# Patient Record
Sex: Male | Born: 1950 | Hispanic: Yes | Marital: Married | Smoking: Former smoker
Health system: Southern US, Community
[De-identification: ages and names within clinical notes are randomized; demographics above are authoritative.]

---

## 2006-10-29 ENCOUNTER — Ambulatory Visit: Payer: Self-pay | Admitting: Internal Medicine

## 2006-10-29 ENCOUNTER — Inpatient Hospital Stay (HOSPITAL_COMMUNITY): Admission: EM | Admit: 2006-10-29 | Discharge: 2006-10-30 | Payer: Self-pay | Admitting: Emergency Medicine

## 2008-06-21 IMAGING — CT CT HEAD W/O CM
1 of 2 series · 13 of 30 positions shown, 17 images · non-contrast
Comparison: None.

CLINICAL DATA: The patient hit the back of his head during a syncopal episode
today.

HEAD CT WITHOUT CONTRAST
TECHNIQUE: 5mm collimated images were obtained from the base of the skull
through the vertex, according to standard protocol, without contrast.

[Series 2: brain · axial · 0.47mm/px · z∈[+98,+231]mm · 13 of 32 slices shown, 17 images]
[im 3/32  brain]
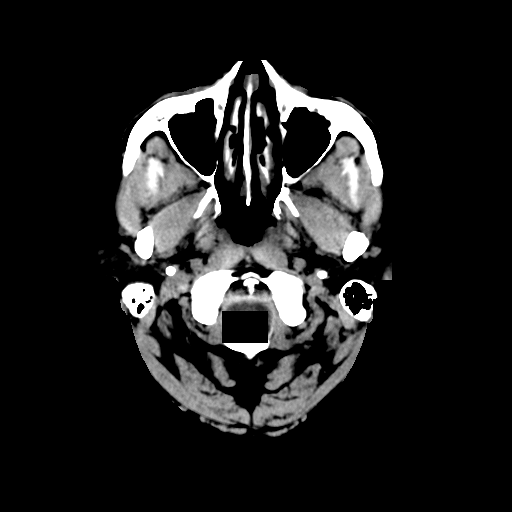
[im 3/32  bone]
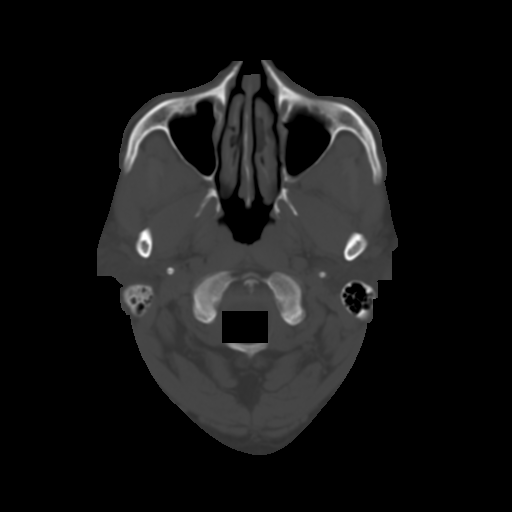
[im 5/32  brain]
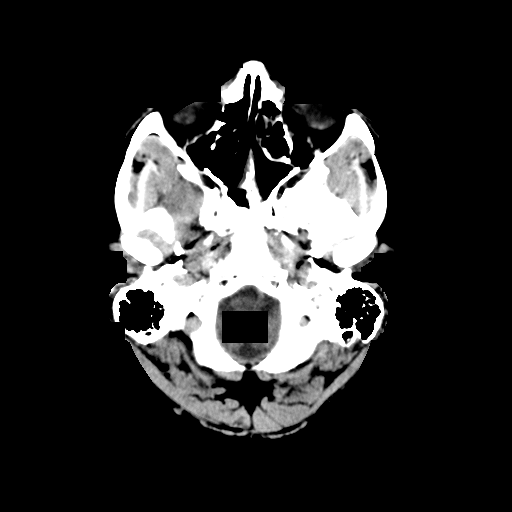
[im 7/32  brain]
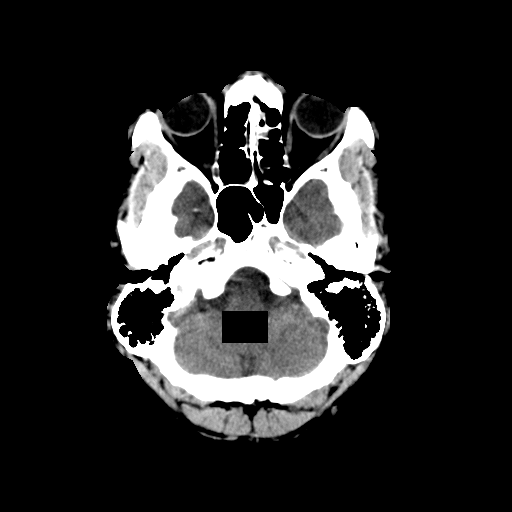
[im 9/32  brain]
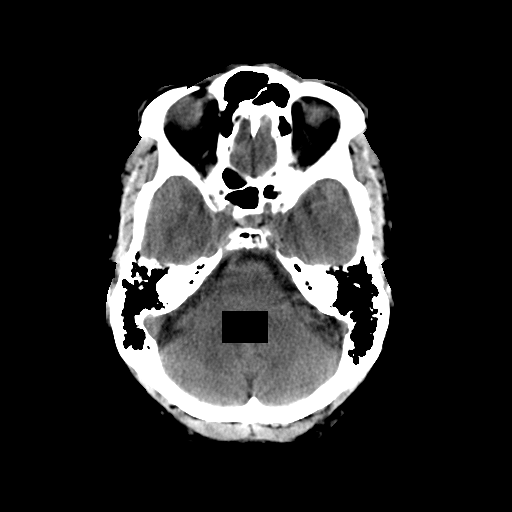
[im 12/32  brain]
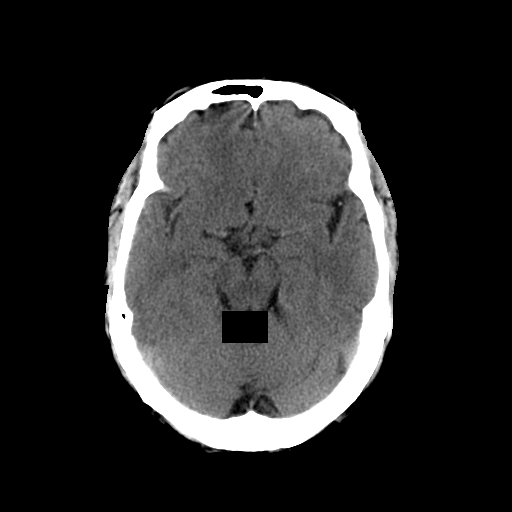
[im 12/32  bone]
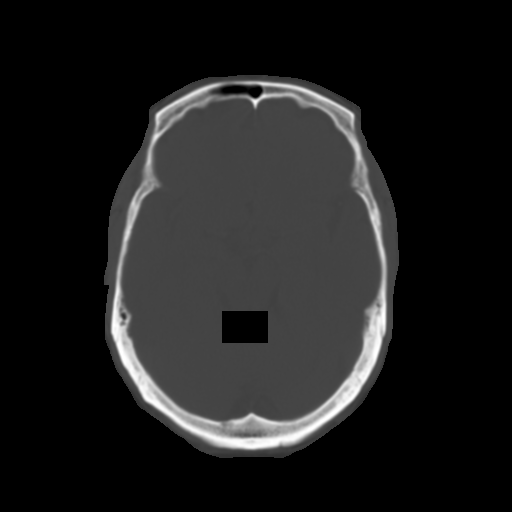
[im 14/32  brain]
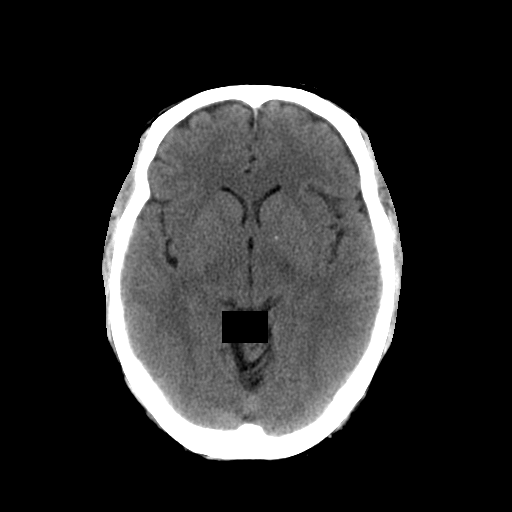
[im 16/32  brain]
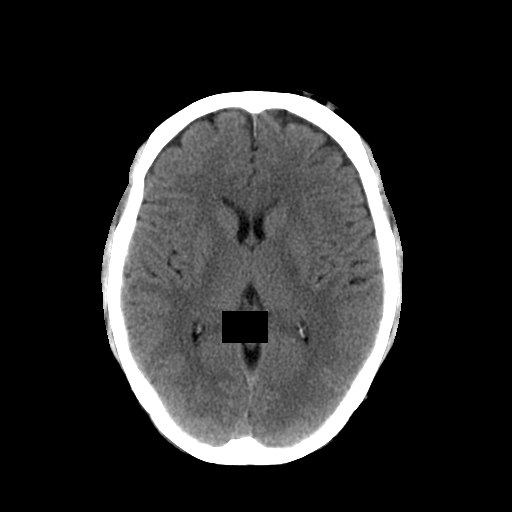
[im 18/32  brain]
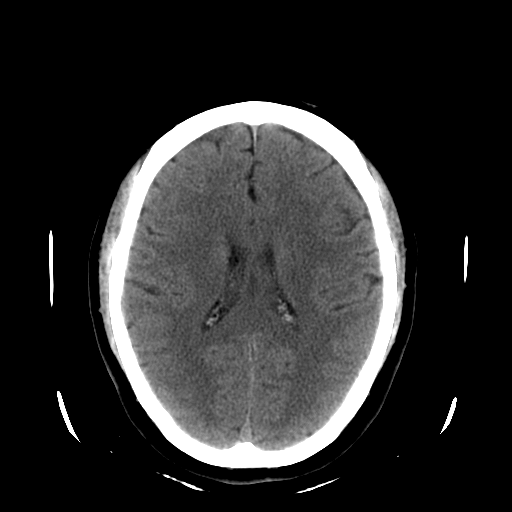
[im 20/32  brain]
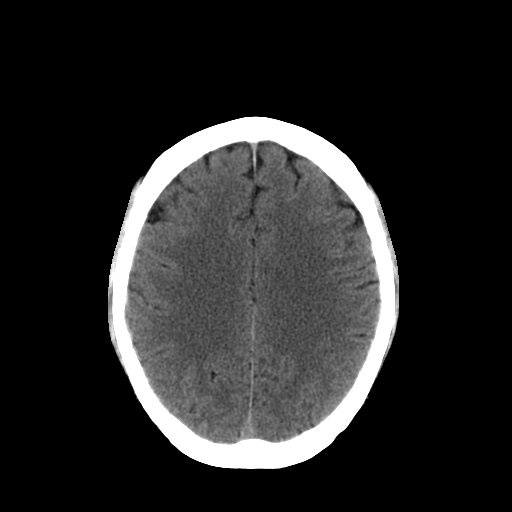
[im 20/32  bone]
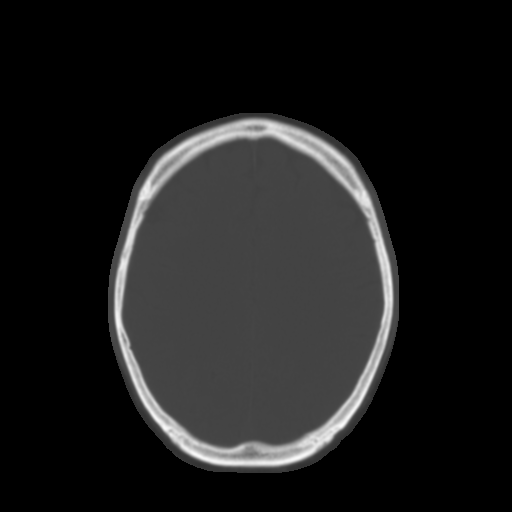
[im 23/32  brain]
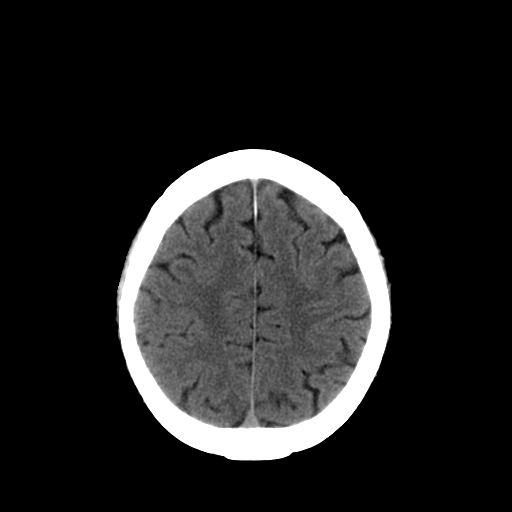
[im 25/32  brain]
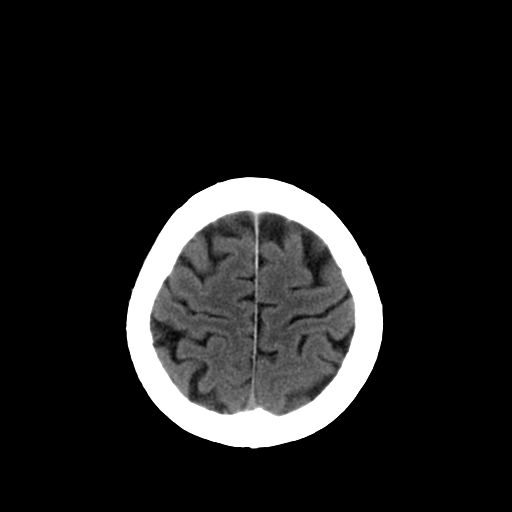
[im 27/32  brain]
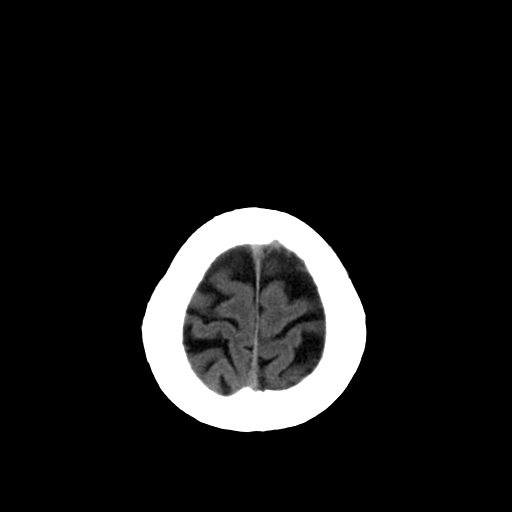
[im 29/32  brain]
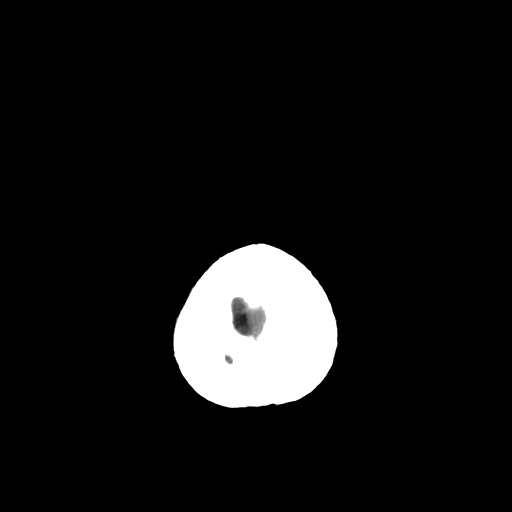
[im 29/32  bone]
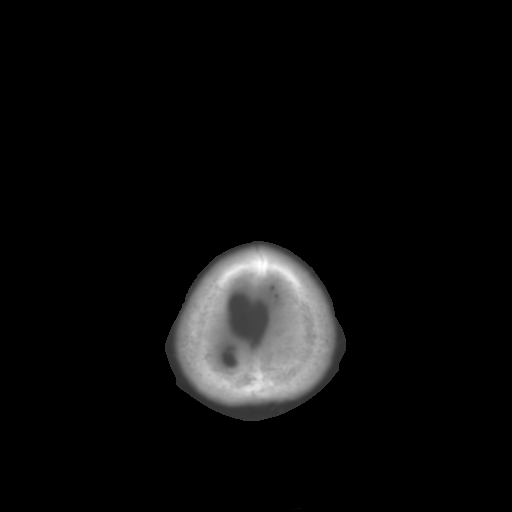

[13 of 30 positions shown; findings below may reference images not displayed]

FINDINGS: Normal appearing cerebral hemispheres and posterior fossa structures.
Normal size and position of the ventricles.  No skull fracture, intracranial
hemorrhage, mass effect or paranasal sinus air/fluid levels. Bilateral ethmoid
sinus mucosal thickening is noted.

IMPRESSION

Chronic bilateral ethmoid sinusitis. Otherwise, normal examination.

## 2010-10-10 NOTE — H&P (Signed)
NAME:  Clarence Marshall, Clarence Marshall                   ACCOUNT NO.:  [id number]   MEDICAL RECORD NO.:  [id number]          PATIENT TYPE:  INP   LOCATION:  1825                         FACILITY:  [facility]   PHYSICIAN:  Bevelyn Buckles. Bensimhon, MDDATE OF BIRTH:  03-07-1951   DATE OF ADMISSION:  10/29/2006  DATE OF DISCHARGE:                              HISTORY & PHYSICAL   PRIMARY CARDIOLOGIST:  Bevelyn Buckles. Bensimhon, MD   PRIMARY CARE PHYSICIAN:  [facility] - Dr. Christell Constant.   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  He states that he did not lose consciousness but  things did go black very quickly and transiently.  He had a friend who  was there with him who did not allow him to get up.  The patient did  bump his head.  They called EMS and EMS came and assisted him and  everything vital sign wise was found to be normal but he was recommended  to be transported to the emergency room.  The patient refused.  A family  member drove him to his family practice physician where he was seen and  examined.  An EKG was completed.  Initially the EKG showed some ST  elevation noted in V3 and V4 and this EKG was faxed to our office and  was read by Dr. Juanda Chance who felt this patient was having an ST elevated  MI and recommended immediate transport to [facility] ER via EMS.  The  patient refused transport via EMS and insisted that family members bring  him instead.  During initial evaluation at [facility], along  with EKG.  The patient also had aspirin given and his blood sugar was  checked and was found to be 150.  Orthostatics were also completed and  blood pressure lying was 112/79.  Blood pressure sitting 108/71 and  blood pressure standing was 108/72.  Of note, the patient did not  experience  shortness of breath, chest pain, nausea, vomiting or  diaphoresis during the episode described above.  The patient was seen  and examined in the emergency room by myself and Dr. Arvilla Meres.  Repeat EKG revealed normal sinus rhythm without any acute ST T wave  changes.  The patient had been complaining of some headaches which had  been going on over the last week prior to the syncopal episode.   REVIEW OF SYSTEMS:  Positive for near syncope.  Negative for chest pain,  shortness of breath, diaphoresis, nausea or vomiting.   PAST MEDICAL HISTORY:  BPH.   SOCIAL HISTORY:  The patient lives in [place] with his wife.  He works in  Doctor, hospital.  He is married with children.  He is a 12-pack-  year smoker but he stopped 20 years ago.  He had heavy alcohol use in  the past but does not drink as much  now.  No illicit drug use.  He is  very active doing outdoor labor. He is not on any specific diet.  No  herbal medicine use.   FAMILY HISTORY:  Mother with multiple myocardial infarctions but died of  breast cancer.  Father died of a gunshot wound when he was a child.  He  has a brother with rheumatoid arthritis   CURRENT MEDICATIONS:  None.   ALLERGIES:  Lamisil.   LABORATORY DATA:  Pending.   PHYSICAL EXAMINATION:  VITAL SIGNS:  Blood pressure 115/69, pulse 71,  respirations 20, temperature was 97.1.  The patient weighed 168.4  pounds.  EKG now revealing normal sinus rhythm.  HEENT:  Head is normocephalic and atraumatic.  Eyes are PERRLA.  Mucous  membranes of the mouth are pink and moist.  Tongue is midline.  NECK:  Supple.  There is no JVD.  There are no carotid bruits  appreciated.  CARDIOVASCULAR:  Regular rate and rhythm without murmurs, rubs or  gallops.  LUNGS:  Clear to auscultation.  ABDOMEN:  Soft and nontender.  There is no rebound or guarding.  EXTREMITIES:  Without clubbing, cyanosis or edema.  NEURO:  Cranial nerves II-XII are grossly intact.  There are no  deficits  noted on neuro exam.   IMPRESSION:  Syncope and pre-syncopal episode.  Suspect this is  secondary to inhalation.  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   PLAN:  We will admit him to rule out cardiac etiology of this episode.  We will check an ABG for carboxy hemoglobin.  We will also do an  echocardiogram for LV function.  We will also do a head CT to rule out  any abnormalities which may be etiology for syncopal episode and chronic  headaches.  The patient will be admitted over night and if ruled out, he  will be discharged.  The patient will be recommended for an outpatient  Myoview if things are normal during this hospitalization.  This has been  discussed with the patient who verbalizes understanding and is wanting  to be admitted and undergo testing.  Further recommendations for this  patient will be depending upon hospital course and results.      Bettey Mare. Lyman Bishop, NP      Bevelyn Buckles. Bensimhon, MD  Electronically Signed    KML/MEDQ  D:  10/29/2006  T:  10/29/2006  Job:  [id number]   cc:   Dr. Christell Constant

## 2010-10-10 NOTE — Discharge Summary (Signed)
NAME:  Clarence Marshall, Clarence Marshall                   ACCOUNT NO.:  [id number]   MEDICAL RECORD NO.:  [id number]          PATIENT TYPE:  INP   LOCATION:  4733                         FACILITY:  [facility]   PHYSICIAN:  Bevelyn Buckles. Bensimhon, MDDATE OF BIRTH:  10-15-1950   DATE OF ADMISSION:  10/29/2006  DATE OF DISCHARGE:  10/30/2006                               DISCHARGE SUMMARY   PRIMARY CARDIOLOGIST:  Dr. Arvilla Meres.   PRIMARY CARE PHYSICIAN:  Dr. Christell Constant at [facility].   PROCEDURES PERFORMED DURING HOSPITALIZATION:  None.   DISCHARGE DIAGNOSES:  1. Syncopal episode in the setting of carbon monoxide poisoning.  2. Hypercholesterolemia.   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  The  patient did not totally loose consciousness, but did fall and hit his  head.  EMS was called and vital signs were taken and found to be normal.  They advised that the patient come to the emergency room, but he refused  and did follow up with Dr. Kathi Der office at [facility] secondary to this event.   During the evaluation at [facility], the patient  had an EKG that was found to be abnormal revealing ST elevation in V3  and V4.  The EKG was faxed to our office and read by Dr. Juanda Chance who felt  the patient was having an ST elevated MI and recommended immediate  transport to [facility] ER via EMS.  The patient refused transport via  EMS and insisting that his family members bring him.  He was without  chest pain or shortness of breath and the syncopal episode did not  return.  His main complaint was a headache.  During evaluation at  [facility], the patient was given aspirin and  his blood sugar was evaluated and found to be 150.  The patient did sign  AMA for refusal to be transported via EMS and did come to the emergency  room through family member.   On evaluation in the emergency room, the patient was seen and examined  by myself and Dr. Arvilla Meres.  Repeat EKG revealed normal sinus  rhythm without any acute ST/T wave changes and a code stemi was  cancelled.  The patient's only complaint was of headaches, which had  been ongoing over the last week prior to the syncopal episode.   A friend who was working with Mr. 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  A carboxyhemoglobin was drawn to evaluate for this in the  setting of this episode and was found to be severely high at 12.0.  His  FaO2 was 95.5.  The patient's troponins were also cycled and found to be  negative x3.  The patient also had a CT scan completed to evaluate for  any fractured leads or abnormalities.  CT scan revealed chronic  bilateral ethmoid sinusitis; otherwise, normal examination.  Subsequent  EKGs revealed normal sinus rhythm without any evidence of ST/T wave  abnormality.  The patient was seen and examined by Dr. Arvilla Meres  date of discharged and found to be stable.  Echocardiogram will be  completed prior to his discharge with followup outpatient stress Myoview  and appointment with Dr. Gala Romney on discharge.  Dr. Gala Romney also did  call [employer]SHA to report the carbon monoxide poisoning in the setting of this  patient's episode and also will follow up with a letter  concerning this  and the plan has been notified as well.   DISCHARGE LABS:  Cholesterol 217, triglycerides 138, HDL 37, LDL 152.  Sodium 134, potassium 4.2, chloride 101, CO2 26, glucose 102, BUN 19,  creatinine 1.0.  Carboxyhemoglobin 12.0.  O2 saturation 95.5.  Total  hemoglobin 13.7.  Hemoglobin 14.9, hematocrit 43.3, white blood cell is  8.6, platelets 236.   VITALS ON DISCHARGE:  Blood pressure 104/63, pulse 69, respirations 18,  temperature 97.1, O2 saturation 97% on room air.   DISCHARGE MEDICATIONS:  None.   FOLLOWUP PLANS AND APPOINTMENT:  1. The patient is scheduled for a stress test on June 13 at 9:30 a.m.      The patient has been advised to have nothing to eat after midnight      prior to the test.  2. The patient has a followup appointment with Dr. Arvilla Meres on      July 3 at 10:15 a.m.  3. The proper authorities have been notified by Dr. Gala Romney      concerning the patient's work place and carbon monoxide poisoning.  4. The patient will follow up with Dr. Christell Constant at [facility] for any further medical management necessary.  5. Echocardiogram results will be discussed with the patient when seen      by Dr. Gala Romney.  6. Determination of need to start on statins secondary to elevated      cholesterol and LDL at Dr. Prescott Gum discretion.   Time spent with the patient, to include physician time, 40 minutes.      Bettey Mare. Lyman Bishop, NP      Bevelyn Buckles. Bensimhon, MD  Electronically Signed    KML/MEDQ  D:  10/30/2006  T:  10/30/2006  Job:  [id number]   cc:   Christell Constant, M.D.

## 2011-03-15 LAB — CK TOTAL AND CKMB (NOT AT ARMC)
CK, MB: 1.4
Relative Index: 1.3
Total CK: 107

## 2011-03-15 LAB — BASIC METABOLIC PANEL
CO2: 26
Chloride: 101
GFR calc Af Amer: >60
Glucose, Bld: 102 — ABNORMAL HIGH
Potassium: 4.2
Sodium: 134 — ABNORMAL LOW

## 2011-03-15 LAB — CARDIAC PANEL(CRET KIN+CKTOT+MB+TROPI)
CK, MB: 1
Relative Index: INVALID
Relative Index: INVALID
Total CK: 81

## 2011-03-15 LAB — DIFFERENTIAL
Basophils Relative: 0
Lymphs Abs: 2
Monocytes Absolute: 0.5
Monocytes Relative: 6
Neutro Abs: 6

## 2011-03-15 LAB — CARBOXYHEMOGLOBIN
Carboxyhemoglobin: 12
Methemoglobin: 0.9

## 2011-03-15 LAB — POCT I-STAT 3, ART BLOOD GAS (G3+)
Acid-base deficit: 1
O2 Saturation: 97
Patient temperature: 98.7
TCO2: 24
pCO2 arterial: 38.9

## 2011-03-15 LAB — POCT CARDIAC MARKERS
CKMB, poc: <1 — ABNORMAL LOW
Myoglobin, poc: 133
Troponin i, poc: <0.05

## 2011-03-15 LAB — COMPREHENSIVE METABOLIC PANEL
ALT: 27
Albumin: 4.2
Alkaline Phosphatase: 61
Calcium: 9.6
Potassium: 4.1
Sodium: 138
Total Protein: 7.7

## 2011-03-15 LAB — CBC
Hemoglobin: 14.9
RBC: 4.57
WBC: 8.6

## 2011-03-15 LAB — TROPONIN I: Troponin I: 0.01

## 2011-03-15 LAB — LIPID PANEL
Cholesterol: 217 — ABNORMAL HIGH
LDL Cholesterol: 152 — ABNORMAL HIGH
Triglycerides: 138
VLDL: 28

## 2011-03-15 LAB — PROTIME-INR: INR: 1

## 2012-10-28 ENCOUNTER — Ambulatory Visit (INDEPENDENT_AMBULATORY_CARE_PROVIDER_SITE_OTHER): Payer: BC Managed Care – PPO | Admitting: Family Medicine

## 2012-10-28 ENCOUNTER — Encounter: Payer: Self-pay | Admitting: Family Medicine

## 2012-10-28 VITALS — BP 109/66 | HR 64 | Temp 96.7°F | Ht 65.0 in | Wt 155.8 lb

## 2012-10-28 DIAGNOSIS — L239 Allergic contact dermatitis, unspecified cause: Secondary | ICD-10-CM

## 2012-10-28 DIAGNOSIS — L259 Unspecified contact dermatitis, unspecified cause: Secondary | ICD-10-CM

## 2012-10-28 NOTE — Progress Notes (Signed)
  Subjective:    Patient ID: Clarence Marshall, male    DOB: 09/16/1950, 62 y.o.   MRN: [id number]  HPI Patient has noticed a rash off and on for a couple of days more prominent in the axillary areas under his arms on his [place] and his upper legs. He is thinking it may have come from a deoderant that he was using.    Review of Systems  Skin: Positive for rash (itchy x 2 days, started in the axillary area, arms ,legs and abdomen).       Objective:   Physical Exam  Vitals reviewed. Constitutional: He is oriented to person, place, and time. He appears well-developed and well-nourished. No distress.  HENT:  Head: Normocephalic.  Musculoskeletal: He exhibits no edema.  Neurological: He is alert and oriented to person, place, and time.  Skin: Skin is warm and dry. No rash noted.   No rash apparent at this time.       Assessment & Plan:  Allergic dermatitis  Patient Instructions  Avoid scented deodorant-----ban scent free is best Avoid scented fabric softeners----use  scent  Free Use [place] Snow or dreft detergent Use Dove or [place] soap for bathing Take Benadryl 25 mg over-the-counter if needed for  rash or itching If rash continues to occur call back and we will call in a prescription for prednisone

## 2012-10-28 NOTE — Patient Instructions (Addendum)
Avoid scented deodorant-----ban scent free is best Avoid scented fabric softeners----use  scent  Free Use [place] Snow or dreft detergent Use Dove or [place] soap for bathing Take Benadryl 25 mg over-the-counter if needed for  rash or itching If rash continues to occur call back and we will call in a prescription for prednisone

## 2013-12-16 ENCOUNTER — Encounter: Payer: Self-pay | Admitting: Family Medicine

## 2013-12-16 ENCOUNTER — Ambulatory Visit (INDEPENDENT_AMBULATORY_CARE_PROVIDER_SITE_OTHER): Payer: BC Managed Care – PPO | Admitting: Family Medicine

## 2013-12-16 VITALS — BP 116/64 | HR 69 | Temp 98.0°F | Ht 65.0 in | Wt 158.0 lb

## 2013-12-16 DIAGNOSIS — N529 Male erectile dysfunction, unspecified: Secondary | ICD-10-CM

## 2013-12-16 DIAGNOSIS — N521 Erectile dysfunction due to diseases classified elsewhere: Secondary | ICD-10-CM | POA: Insufficient documentation

## 2013-12-16 MED ORDER — SILDENAFIL CITRATE 100 MG PO TABS: 50.0000 mg | ORAL_TABLET | Freq: Every day | ORAL | Status: DC | PRN

## 2013-12-16 NOTE — Patient Instructions (Signed)
Call insurance company to verify coverage of tetanus shot. May schedule an appointment with the nurse to receive shot.

## 2013-12-16 NOTE — Addendum Note (Signed)
Addended by: Gwenith DailyHUDY, KRISTEN N on: 12/16/2013 03:20 PM   Modules accepted: Orders

## 2013-12-16 NOTE — Progress Notes (Signed)
   Subjective:    Patient ID: Clarence Marshall, male    DOB: 05/11/1951, 63 y.o.   MRN: [id number]  HPI 63 year old male here to seemingly get reestablished. He says that he has been a patient here for many many years. He is very elevation he has some concerns about a tick bite that has persisted carotis for 2-3 weeks. He also has a concern about erectile dysfunction and is requesting a prescription for Viagra.    Review of Systems  Constitutional: Negative.   HENT: Negative.   Eyes: Negative.   Respiratory: Negative.   Cardiovascular: Negative.   Gastrointestinal: Negative.   Genitourinary: Negative.   Musculoskeletal: Negative.   Skin: Negative.   Neurological: Negative.   Psychiatric/Behavioral: Negative.        Objective:   Physical Exam  Constitutional: He is oriented to person, place, and time. He appears well-developed and well-nourished.  HENT:  Head: Normocephalic.  Right Ear: External ear normal.  Left Ear: External ear normal.  Nose: Nose normal.  Mouth/Throat: Oropharynx is clear and moist.  Eyes: Conjunctivae and EOM are normal. Pupils are equal, round, and reactive to light.  Neck: Normal range of motion. Neck supple.  Cardiovascular: Normal rate, regular rhythm, normal heart sounds and intact distal pulses.   Pulmonary/Chest: Effort normal and breath sounds normal.  Abdominal: Soft. Bowel sounds are normal.  Musculoskeletal: Normal range of motion.  Neurological: He is alert and oriented to person, place, and time.  Skin: Skin is warm and dry.  Psychiatric: He has a normal mood and affect. His behavior is normal. Judgment and thought content normal.          Assessment & Plan:  This is basically a healthy male. We discussed preventive measures such as Prevnar DTaP and he will think about those. I did provide a prescription for Viagra there being no contraindication that I am aware .Frederica KusterStephen M Lakoda Mcanany MD

## 2018-09-23 ENCOUNTER — Ambulatory Visit (INDEPENDENT_AMBULATORY_CARE_PROVIDER_SITE_OTHER): Payer: BC Managed Care – PPO | Admitting: Family Medicine

## 2018-09-23 ENCOUNTER — Encounter: Payer: Self-pay | Admitting: Family Medicine

## 2018-09-23 ENCOUNTER — Other Ambulatory Visit: Payer: Self-pay

## 2018-09-23 DIAGNOSIS — L247 Irritant contact dermatitis due to plants, except food: Secondary | ICD-10-CM

## 2018-09-23 MED ORDER — PREDNISONE 10 MG PO TABS: ORAL_TABLET | ORAL | 0 refills | Status: DC

## 2018-09-23 NOTE — Progress Notes (Signed)
Subjective:    Patient ID: Clarence Marshall, male    DOB: [birth year], 68 y.o.   MRN: [id number]   HPI: Clarence Marshall is a 68 y.o. male presenting for poison ivy rash for 2 weeks. Some relief with benadryl pills and caladryl cream. Forearms still itch. Flares when active and when laying on arms.Marland Kitchen Applying cool water and alcohol.  Smal red bumps. No blistering. Flushed redness as well. Covers all of each ventral forearm.   (I attempted to switch to video, but the patinet's phone would not connect to the Doximity link.)   Relevant past medical, surgical, family and social history reviewed and updated as indicated.  Interim medical history since our last visit reviewed. Allergies and medications reviewed and updated.  ROS:  Review of Systems  Constitutional: Negative for fever.  Respiratory: Negative for shortness of breath.   Cardiovascular: Negative for chest pain.  Skin: Negative for rash.     Social History   Tobacco Use  Smoking Status Former Smoker  . Packs/day: 1.00  . Start date: 05/28/1972  . Last attempt to quit: 10/29/1982  . Years since quitting: 35.9       Objective:     Wt Readings from Last 3 Encounters:  12/16/13 158 lb (71.7 kg)  10/28/12 155 lb 12.8 oz (70.7 kg)     Exam deferred. Pt. Harboring due to COVID 19. Phone visit performed.   Assessment & Plan:   1. Irritant contact dermatitis due to plants, except food     Meds ordered this encounter  Medications  . DISCONTD: predniSONE (DELTASONE) 10 MG tablet    Sig: Take 5 daily for 2 days followed by 4,3,2 and 1 for 2 days each.    Dispense:  30 tablet    Refill:  0  . predniSONE (DELTASONE) 10 MG tablet    Sig: Take 5 daily for 2 days followed by 4,3,2 and 1 for 2 days each.    Dispense:  30 tablet    Refill:  0    No orders of the defined types were placed in this encounter.     Diagnoses and all orders for this visit:  Irritant contact dermatitis due to plants, except food  Other orders -      Discontinue: predniSONE (DELTASONE) 10 MG tablet; Take 5 daily for 2 days followed by 4,3,2 and 1 for 2 days each. -     predniSONE (DELTASONE) 10 MG tablet; Take 5 daily for 2 days followed by 4,3,2 and 1 for 2 days each.    Virtual Visit via telephone Note  I discussed the limitations, risks, security and privacy concerns of performing an evaluation and management service by telephone and the availability of in person appointments. The patient was identified with two identifiers. Pt.expressed understanding and agreed to proceed. Pt. Is at home. Dr. Darlyn Read is in his office.  Follow Up Instructions:   I discussed the assessment and treatment plan with the patient. The patient was provided an opportunity to ask questions and all were answered. The patient agreed with the plan and demonstrated an understanding of the instructions.   The patient was advised to call back or seek an in-person evaluation if the symptoms worsen or if the condition fails to improve as anticipated.  Visit started: 3:20 Call ended:  3:37 Total minutes including chart review and phone contact time: 17   Follow up plan: Return if symptoms worsen or fail to improve.  Mechele Claude, MD [place]  [facility]

## 2022-01-24 ENCOUNTER — Ambulatory Visit: Payer: BC Managed Care – PPO | Admitting: Family Medicine

## 2022-02-13 ENCOUNTER — Ambulatory Visit: Payer: 59 | Admitting: Family Medicine

## 2022-02-13 ENCOUNTER — Encounter: Payer: Self-pay | Admitting: Family Medicine

## 2022-02-13 VITALS — BP 123/67 | HR 63 | Temp 98.2°F | Ht 65.0 in | Wt 159.4 lb

## 2022-02-13 DIAGNOSIS — Z125 Encounter for screening for malignant neoplasm of prostate: Secondary | ICD-10-CM | POA: Diagnosis not present

## 2022-02-13 DIAGNOSIS — E559 Vitamin D deficiency, unspecified: Secondary | ICD-10-CM

## 2022-02-13 DIAGNOSIS — Z0001 Encounter for general adult medical examination with abnormal findings: Secondary | ICD-10-CM

## 2022-02-13 DIAGNOSIS — Z0000 Encounter for general adult medical examination without abnormal findings: Secondary | ICD-10-CM

## 2022-02-13 DIAGNOSIS — Z23 Encounter for immunization: Secondary | ICD-10-CM

## 2022-02-13 NOTE — Addendum Note (Signed)
Addended by: Baldomero Lamy B on: 02/13/2022 05:00 PM   Modules accepted: Orders

## 2022-02-13 NOTE — Progress Notes (Signed)
Subjective:  Patient ID: Clarence Marshall, male    DOB: [birth year]  Age: 71 y.o. MRN: [id number]  CC: New Patient (Initial Visit)   HPI Clarence Marshall presents for new  (in a long time) pt. 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   Clarence Marshall has no past surgical history on file.   His family history includes Arthritis in his brother; Heart attack in his mother.Clarence Marshall reports that Clarence Marshall quit smoking about 39 years ago. His smoking use included cigarettes. Clarence Marshall started smoking about 49 years ago. Clarence Marshall smoked an average of 1 pack per day. Clarence Marshall does not have any smokeless tobacco history on file. Clarence Marshall reports current alcohol use. Clarence Marshall reports that Clarence Marshall does not use drugs.    ROS Review of Systems  Constitutional:  Negative for activity change, fatigue and unexpected weight change.  HENT:  Negative for congestion, ear pain, hearing loss, postnasal drip and trouble swallowing.   Eyes:  Positive for visual disturbance (pterygium left eye. Need surgery). Negative for pain.  Respiratory:  Negative for cough, chest tightness and shortness of breath.   Cardiovascular:  Negative for chest pain, palpitations and leg swelling.  Gastrointestinal:  Positive for abdominal pain (occasional heartburn from eating spicy food 1-2 times a week.). Negative for abdominal distention, blood in stool, constipation, diarrhea, nausea and vomiting.  Endocrine: Negative for cold intolerance, heat intolerance and polydipsia.  Genitourinary:  Negative for difficulty urinating, dysuria, flank pain, frequency and urgency.  Musculoskeletal:  Negative for arthralgias and joint swelling.  Skin:  Negative for color change, rash and wound.  Neurological:  Negative for dizziness, syncope, speech difficulty, weakness, light-headedness, numbness and headaches.   Hematological:  Does not bruise/bleed easily.  Psychiatric/Behavioral:  Negative for confusion, decreased concentration, dysphoric mood and sleep disturbance. The patient is not nervous/anxious.     Objective:  BP 123/67   Pulse 63   Temp 98.2 F (36.8 C)   Ht _0  (1.651 m)   Wt 159 lb 6.4 oz (72.3 kg)   SpO2 96%   BMI 26.53 kg/m   BP Readings from Last 3 Encounters:  02/13/22 123/67  12/16/13 116/64  10/28/12 109/66    Wt Readings from Last 3 Encounters:  02/13/22 159 lb 6.4 oz (72.3 kg)  12/16/13 158 lb (71.7 kg)  10/28/12 155 lb 12.8 oz (70.7 kg)     Physical Exam Vitals reviewed.  Constitutional:      Appearance: Clarence Marshall is well-developed.  HENT:     Head: Normocephalic and atraumatic.     Right Ear: External ear normal.     Left Ear: External ear normal.     Mouth/Throat:     Pharynx: No oropharyngeal exudate or posterior oropharyngeal erythema.  Eyes:     Pupils: Pupils are equal, round, and reactive to light.  Cardiovascular:     Rate and Rhythm: Normal rate and regular rhythm.     Heart sounds: No murmur heard. Pulmonary:     Effort: No respiratory distress.     Breath sounds: Normal breath sounds.  Abdominal:     General: Abdomen is flat. Bowel sounds are normal. There is no distension.     Palpations: Abdomen is soft. There is no mass.  Tenderness: There is no abdominal tenderness.  Musculoskeletal:        General: No swelling, tenderness or deformity. Normal range of motion.     Cervical back: Normal range of motion and neck supple.  Skin:    General: Skin is warm and dry.  Neurological:     Mental Status: Clarence Marshall is alert and oriented to person, place, and time.     Motor: No weakness.     Coordination: Coordination normal.  Psychiatric:        Mood and Affect: Mood normal.        Behavior: Behavior normal.        Thought Content: Thought content normal.       Assessment & Plan:   Clarence Marshall was seen today for new patient (initial  visit).  Diagnoses and all orders for this visit:  Well adult exam -     CBC with Differential/Platelet -     CMP14+EGFR -     Lipid panel -     PSA, total and free -     VITAMIN D 25 Hydroxy (Vit-D Deficiency, Fractures)  Screening for prostate cancer -     PSA, total and free  Vitamin D deficiency -     VITAMIN D 25 Hydroxy (Vit-D Deficiency, Fractures)       I have discontinued Clarence Marshall's sildenafil and predniSONE.  Allergies as of 02/13/2022       Reactions   Lamisil [terbinafine] Rash        Medication List        Accurate as of February 13, 2022  2:54 PM. If you have any questions, ask your nurse or doctor.          STOP taking these medications    predniSONE 10 MG tablet Commonly known as: DELTASONE Stopped by: Claretta Fraise, MD   sildenafil 100 MG tablet Commonly known as: Viagra Stopped by: Claretta Fraise, MD         Follow-up: Return in about 1 year (around 02/14/2023).  Claretta Fraise, M.D.

## 2022-02-14 LAB — CMP14+EGFR
ALT: 16 IU/L (ref 0–44)
AST: 14 IU/L (ref 0–40)
Albumin/Globulin Ratio: 1.6 (ref 1.2–2.2)
Albumin: 4.5 g/dL (ref 3.8–4.8)
Alkaline Phosphatase: 64 IU/L (ref 44–121)
BUN/Creatinine Ratio: 17 (ref 10–24)
BUN: 21 mg/dL (ref 8–27)
Bilirubin Total: 0.3 mg/dL (ref 0.0–1.2)
CO2: 22 mmol/L (ref 20–29)
Calcium: 9.7 mg/dL (ref 8.6–10.2)
Chloride: 100 mmol/L (ref 96–106)
Creatinine, Ser: 1.21 mg/dL (ref 0.76–1.27)
Globulin, Total: 2.9 g/dL (ref 1.5–4.5)
Glucose: 95 mg/dL (ref 70–99)
Potassium: 4.7 mmol/L (ref 3.5–5.2)
Sodium: 139 mmol/L (ref 134–144)
Total Protein: 7.4 g/dL (ref 6.0–8.5)
eGFR: 63 mL/min/{1.73_m2} (ref >59)

## 2022-02-14 LAB — CBC WITH DIFFERENTIAL/PLATELET
Basophils Absolute: 0 10*3/uL (ref 0.0–0.2)
Basos: 1 %
EOS (ABSOLUTE): 0.5 10*3/uL — ABNORMAL HIGH (ref 0.0–0.4)
Eos: 7 %
Hematocrit: 41.5 % (ref 37.5–51.0)
Hemoglobin: 14 g/dL (ref 13.0–17.7)
Immature Grans (Abs): 0 10*3/uL (ref 0.0–0.1)
Immature Granulocytes: 0 %
Lymphocytes Absolute: 2.1 10*3/uL (ref 0.7–3.1)
Lymphs: 32 %
MCH: 32.8 pg (ref 26.6–33.0)
MCHC: 33.7 g/dL (ref 31.5–35.7)
MCV: 97 fL (ref 79–97)
Monocytes Absolute: 0.7 10*3/uL (ref 0.1–0.9)
Monocytes: 10 %
Neutrophils Absolute: 3.3 10*3/uL (ref 1.4–7.0)
Neutrophils: 50 %
Platelets: 235 10*3/uL (ref 150–450)
RBC: 4.27 x10E6/uL (ref 4.14–5.80)
RDW: 13.1 % (ref 11.6–15.4)
WBC: 6.6 10*3/uL (ref 3.4–10.8)

## 2022-02-14 LAB — LIPID PANEL
Chol/HDL Ratio: 5.2 ratio — ABNORMAL HIGH (ref 0.0–5.0)
Cholesterol, Total: 229 mg/dL — ABNORMAL HIGH (ref 100–199)
HDL: 44 mg/dL (ref >39)
LDL Chol Calc (NIH): 137 mg/dL — ABNORMAL HIGH (ref 0–99)
Triglycerides: 266 mg/dL — ABNORMAL HIGH (ref 0–149)
VLDL Cholesterol Cal: 48 mg/dL — ABNORMAL HIGH (ref 5–40)

## 2022-02-14 LAB — PSA, TOTAL AND FREE
PSA, Free Pct: 21.7 %
PSA, Free: 0.13 ng/mL
Prostate Specific Ag, Serum: 0.6 ng/mL (ref 0.0–4.0)

## 2022-02-14 LAB — VITAMIN D 25 HYDROXY (VIT D DEFICIENCY, FRACTURES): Vit D, 25-Hydroxy: 29.7 ng/mL — ABNORMAL LOW (ref 30.0–100.0)

## 2022-02-15 NOTE — Progress Notes (Signed)
Dear Clarence Marshall, Your Vitamin D is  low. You need a prescription strength supplement I will send that in for you.Your cholesterol is mildly elevated. Please follow a low-fat diet and exercise regularly. I recommend checking it again in 6 months. Please arrange a follow-up at that time.  Your cholesterol is mildly elevated. Please follow a low-fat diet and exercise regularly. I recommend checking it again in 6 months. Please arrange a follow-up at that time.   Nurse, if at all possible, could you send in a prescription for the patient for vitamin D 50,000 units, 1 p.o. weekly #13 with 3 refills? Many thanks, WS

## 2022-02-19 ENCOUNTER — Other Ambulatory Visit: Payer: Self-pay | Admitting: *Deleted

## 2022-02-19 MED ORDER — VITAMIN D (ERGOCALCIFEROL) 1.25 MG (50000 UNIT) PO CAPS: 50000.0000 [IU] | ORAL_CAPSULE | ORAL | 3 refills | Status: DC

## 2022-08-14 ENCOUNTER — Ambulatory Visit (INDEPENDENT_AMBULATORY_CARE_PROVIDER_SITE_OTHER): Payer: 59 | Admitting: *Deleted

## 2022-08-14 DIAGNOSIS — Z23 Encounter for immunization: Secondary | ICD-10-CM

## 2022-08-14 NOTE — Progress Notes (Signed)
Shingrix given and patient tolerated well.  

## 2023-01-08 ENCOUNTER — Encounter: Payer: Self-pay | Admitting: *Deleted

## 2023-02-11 ENCOUNTER — Ambulatory Visit: Payer: 59 | Admitting: Family Medicine

## 2023-02-11 ENCOUNTER — Encounter: Payer: Self-pay | Admitting: Family Medicine

## 2023-02-11 VITALS — BP 111/66 | HR 64 | Temp 97.8°F | Ht 65.0 in | Wt 164.0 lb

## 2023-02-11 DIAGNOSIS — N401 Enlarged prostate with lower urinary tract symptoms: Secondary | ICD-10-CM

## 2023-02-11 DIAGNOSIS — E559 Vitamin D deficiency, unspecified: Secondary | ICD-10-CM

## 2023-02-11 DIAGNOSIS — M545 Low back pain, unspecified: Secondary | ICD-10-CM

## 2023-02-11 DIAGNOSIS — R351 Nocturia: Secondary | ICD-10-CM | POA: Diagnosis not present

## 2023-02-11 DIAGNOSIS — E782 Mixed hyperlipidemia: Secondary | ICD-10-CM

## 2023-02-11 DIAGNOSIS — N529 Male erectile dysfunction, unspecified: Secondary | ICD-10-CM

## 2023-02-11 DIAGNOSIS — Z0001 Encounter for general adult medical examination with abnormal findings: Secondary | ICD-10-CM | POA: Diagnosis not present

## 2023-02-11 DIAGNOSIS — Z125 Encounter for screening for malignant neoplasm of prostate: Secondary | ICD-10-CM | POA: Diagnosis not present

## 2023-02-11 DIAGNOSIS — Z0000 Encounter for general adult medical examination without abnormal findings: Secondary | ICD-10-CM

## 2023-02-11 DIAGNOSIS — K2100 Gastro-esophageal reflux disease with esophagitis, without bleeding: Secondary | ICD-10-CM

## 2023-02-11 LAB — URINALYSIS
Bilirubin, UA: NEGATIVE
Glucose, UA: NEGATIVE
Ketones, UA: NEGATIVE
Leukocytes,UA: NEGATIVE
Nitrite, UA: NEGATIVE
Protein,UA: NEGATIVE
Specific Gravity, UA: 1.01 (ref 1.005–1.030)
Urobilinogen, Ur: 0.2 mg/dL (ref 0.2–1.0)
pH, UA: 6 (ref 5.0–7.5)

## 2023-02-11 MED ORDER — PREDNISONE 10 MG PO TABS: ORAL_TABLET | ORAL | 0 refills | Status: DC

## 2023-02-11 MED ORDER — TAMSULOSIN HCL 0.4 MG PO CAPS
0.8000 mg | ORAL_CAPSULE | Freq: Every day | ORAL | 3 refills | Status: DC
Start: 2023-02-11 — End: 2024-03-17

## 2023-02-11 MED ORDER — VITAMIN D (ERGOCALCIFEROL) 1.25 MG (50000 UNIT) PO CAPS: 50000.0000 [IU] | ORAL_CAPSULE | ORAL | 3 refills | Status: DC

## 2023-02-11 MED ORDER — SILDENAFIL CITRATE 100 MG PO TABS
50.0000 mg | ORAL_TABLET | Freq: Every day | ORAL | 11 refills | Status: AC | PRN
Start: 2023-02-11 — End: ?

## 2023-02-11 MED ORDER — TIZANIDINE HCL 4 MG PO TABS
4.0000 mg | ORAL_TABLET | Freq: Four times a day (QID) | ORAL | 1 refills | Status: DC | PRN
Start: 2023-02-11 — End: 2024-03-17

## 2023-02-11 NOTE — Progress Notes (Signed)
Subjective:  Patient ID: Clarence Marshall, male    DOB: [birth year]  Age: 72 y.o. MRN: [id number]  CC: Annual Exam and Back Pain   HPI VIVIAN PUROHIT presents for low back pain. Onset 30 years ago. Work required lifting. Lately has flared after doing shoveling and picking. Had a pinched nerve in  past. Feels sharp, pinching now like that did. This episode bad today, onset 2-3 days ago.   Heartburn is less severe now. Trying to avoid greasy & spicy food. Uses famotidine to premedicate if eating spicy.   E D. Problem. Has responded to sildenafil in the past. Would like a refill. Also having nocturia X 2-4 with hesitancy and dribbling in the day,  post micturition       02/11/2023    1:59 PM 02/13/2022    2:25 PM  Depression screen PHQ 2/9  Decreased Interest 0 0  Down, Depressed, Hopeless 0 0  PHQ - 2 Score 0 0    History Jeremie has no past medical history on file.   He has no past surgical history on file.   His family history includes Arthritis in his brother; Heart attack in his mother.He reports that he quit smoking about 40 years ago. His smoking use included cigarettes. He started smoking about 50 years ago. He has a 10.4 pack-year smoking history. He does not have any smokeless tobacco history on file. He reports current alcohol use. He reports that he does not use drugs.    ROS Review of Systems  Constitutional:  Negative for activity change, fatigue and unexpected weight change.  HENT:  Negative for congestion, ear pain, hearing loss, postnasal drip and trouble swallowing.   Eyes:  Negative for pain and visual disturbance.  Respiratory:  Negative for cough, chest tightness and shortness of breath.   Cardiovascular:  Negative for chest pain, palpitations and leg swelling.  Gastrointestinal:  Positive for abdominal pain (heartburn - see HPI). Negative for abdominal distention, blood in stool, constipation, diarrhea, nausea and vomiting.  Endocrine: Negative for cold intolerance,  heat intolerance and polydipsia.  Genitourinary:  Positive for difficulty urinating (hesitancy and dribbling.), frequency and urgency. Negative for dysuria, flank pain, penile pain and testicular pain.  Musculoskeletal:  Positive for back pain. Negative for arthralgias and joint swelling.  Skin:  Negative for color change, rash and wound.  Neurological:  Negative for dizziness, syncope, speech difficulty, weakness, light-headedness, numbness and headaches.  Hematological:  Does not bruise/bleed easily.  Psychiatric/Behavioral:  Negative for confusion, decreased concentration, dysphoric mood and sleep disturbance. The patient is not nervous/anxious.     Objective:  BP 111/66   Pulse 64   Temp 97.8 F (36.6 C)   Ht 5\' 5"  (1.651 m)   Wt 164 lb (74.4 kg)   SpO2 98%   BMI 27.29 kg/m   BP Readings from Last 3 Encounters:  02/11/23 111/66  02/13/22 123/67  12/16/13 116/64    Wt Readings from Last 3 Encounters:  02/11/23 164 lb (74.4 kg)  02/13/22 159 lb 6.4 oz (72.3 kg)  12/16/13 158 lb (71.7 kg)     Physical Exam Constitutional:      Appearance: He is well-developed.  HENT:     Head: Normocephalic and atraumatic.  Eyes:     Pupils: Pupils are equal, round, and reactive to light.  Neck:     Thyroid: No thyromegaly.     Trachea: No tracheal deviation.  Cardiovascular:     Rate and Rhythm: Normal rate  and regular rhythm.     Heart sounds: Normal heart sounds. No murmur heard.    No friction rub. No gallop.  Pulmonary:     Breath sounds: Normal breath sounds. No wheezing or rales.  Abdominal:     General: Bowel sounds are normal. There is no distension.     Palpations: Abdomen is soft. There is no mass.     Tenderness: There is no abdominal tenderness.     Hernia: There is no hernia in the left inguinal area.  Genitourinary:    Penis: Normal.      Testes: Normal.  Musculoskeletal:        General: Normal range of motion.     Cervical back: Normal range of motion.   Lymphadenopathy:     Cervical: No cervical adenopathy.  Skin:    General: Skin is warm and dry.  Neurological:     Mental Status: He is alert and oriented to person, place, and time.       Assessment & Plan:   Seiko was seen today for annual exam and back pain.  Diagnoses and all orders for this visit:  Mixed hyperlipidemia  Vitamin D deficiency -     VITAMIN D 25 Hydroxy (Vit-D Deficiency, Fractures) -     Vitamin D, Ergocalciferol, (DRISDOL) 1.25 MG (50000 UNIT) CAPS capsule; Take 1 capsule (50,000 Units total) by mouth every 7 (seven) days.  Screening for prostate cancer -     PSA, total and free  Well adult exam -     CBC with Differential/Platelet -     CMP14+EGFR -     Lipid panel -     PSA, total and free -     VITAMIN D 25 Hydroxy (Vit-D Deficiency, Fractures)  Gastroesophageal reflux disease with esophagitis without hemorrhage -     Urinalysis  Benign prostatic hyperplasia with nocturia -     PSA, total and free -     Urinalysis -     tamsulosin (FLOMAX) 0.4 MG CAPS capsule; Take 2 capsules (0.8 mg total) by mouth at bedtime. For urine flow and prostate  Acute midline low back pain without sciatica -     Urinalysis -     predniSONE (DELTASONE) 10 MG tablet; Take 5 daily for 3 days followed by 4,3,2 and 1 for 3 days each. -     tiZANidine (ZANAFLEX) 4 MG tablet; Take 1 tablet (4 mg total) by mouth every 6 (six) hours as needed for muscle spasms.  Vasculogenic erectile dysfunction, unspecified vasculogenic erectile dysfunction type -     sildenafil (VIAGRA) 100 MG tablet; Take 0.5-1 tablets (50-100 mg total) by mouth daily as needed for erectile dysfunction (sex).       I am having Nydia Bouton start on sildenafil, tamsulosin, predniSONE, and tiZANidine. I am also having him maintain his Vitamin D (Ergocalciferol).  Allergies as of 02/11/2023       Reactions   Lamisil [terbinafine] Rash        Medication List        Accurate as of February 11, 2023  2:39 PM. If you have any questions, ask your nurse or doctor.          predniSONE 10 MG tablet Commonly known as: DELTASONE Take 5 daily for 3 days followed by 4,3,2 and 1 for 3 days each. Started by: Eman Morimoto   sildenafil 100 MG tablet Commonly known as: Viagra Take 0.5-1 tablets (50-100 mg total) by mouth daily  as needed for erectile dysfunction (sex). Started by: Broadus John Sahiti Joswick   tamsulosin 0.4 MG Caps capsule Commonly known as: FLOMAX Take 2 capsules (0.8 mg total) by mouth at bedtime. For urine flow and prostate Started by: Abubakr Wieman   tiZANidine 4 MG tablet Commonly known as: ZANAFLEX Take 1 tablet (4 mg total) by mouth every 6 (six) hours as needed for muscle spasms. Started by: Paz Winsett   Vitamin D (Ergocalciferol) 1.25 MG (50000 UNIT) Caps capsule Commonly known as: DRISDOL Take 1 capsule (50,000 Units total) by mouth every 7 (seven) days.         Follow-up: Return in about 6 weeks (around 03/25/2023).  Mechele Claude, M.D.

## 2023-02-11 NOTE — Patient Instructions (Signed)
Ejercicios para la espalda Back Exercises Los siguientes ejercicios fortalecen los msculos que dan soporte al tronco (torso) y a Scientific laboratory technician. Adems, ayudan a mantener la flexibilidad de la zona lumbar. Hacer estos ejercicios puede ser de ayuda para evitar o Advertising copywriter. Si tiene dolor o [employer] espalda, intente hacer estos ejercicios 2 o 3 veces por da, o como se lo haya indicado el mdico. A medida que el dolor desaparece, hgalos una vez por da, pero aumente la cantidad de veces que repite los pasos para cada ejercicio (haga ms repeticiones). Para prevenir la recurrencia del dolor de espalda, contine haciendo estos ejercicios una vez al da o como se lo haya indicado el mdico. Haga los ejercicios exactamente como se lo haya indicado el mdico y gradelos como se lo hayan indicado. Es normal sentir un leve estiramiento, tirn, rigidez o molestia cuando haga estos ejercicios, pero debe detenerse de inmediato si siente un dolor repentino o si el dolor empeora. Ejercicios Rodilla al pecho Repita estos pasos 3 a 5 veces con cada pierna: Acustese boca arriba sobre una cama dura o sobre el suelo con las piernas extendidas. Lleve una rodilla al pecho. La otra pierna debe quedar extendida y en contacto con el suelo. Mantenga la rodilla contra el pecho al tomarse la rodilla o el muslo con ambas manos y Consulting civil engineer. Tire de la rodilla hasta sentir una elongacin suave en la parte baja de la espalda o las nalgas. Mantenga la elongacin durante 10 a 30 segundos. Suelte y extienda la pierna lentamente.  Inclinacin de la pelvis Repita estos pasos 5 a 10 veces: Acustese boca arriba sobre una cama dura o sobre el suelo con las piernas extendidas. Flexione las rodillas de modo que apunten al techo y los pies queden apoyados en el suelo. Contraiga los msculos de la parte baja del abdomen para empujar la zona lumbar contra el suelo. Con este movimiento se inclinar la pelvis de modo que  el coxis apunte hacia el techo, en lugar de apuntar a los pies o al suelo. Contraiga suavemente y respire con normalidad mientras mantiene esta posicin durante 5 a 10 segundos.  El perro y el gato Repita estos pasos hasta que la zona lumbar se vuelva ms flexible: Apoye las palmas de las manos y las rodillas sobre una cama firme o el suelo. Las manos deben estar alineadas con los hombros y las rodillas con las caderas. Puede colocarse almohadillas debajo de las rodillas para estar cmodo. Deje que la cabeza cuelgue hacia el pecho. Contraiga los msculos abdominales y baje el coxis en direccin al suelo de modo que la zona lumbar se arquee como el lomo de un [place]. Mantenga esta posicin durante 5 segundos. Lentamente, levante la cabeza, relaje los msculos abdominales y eleve el coxis de modo que apunte en direccin al techo para que la espalda forme un arco hundido como el lomo de un perro contento. Mantenga esta posicin durante 5 segundos.  Flexiones de Public house manager pasos 5 a 10 veces: Acustese sobre el abdomen (boca abajo) en una cama firme o en el suelo. Coloque las palmas de las manos cerca de la cabeza, separadas aproximadamente al ancho de los hombros. Con la espalda lo ms relajada posible y las caderas apoyadas en el suelo, extienda lentamente los brazos para levantar la mitad superior del cuerpo y Optometrist los hombros. No use los msculos de la espalda para elevar la parte superior del torso. Puede [employer] de  lugar para estar ms cmodo. Mantenga esta posicin durante 5 segundos mientras mantiene la espalda relajada. Lentamente vuelva a la posicin horizontal.  Puentes Repita estos pasos 10 veces: Acustese boca arriba sobre una cama firme o sobre el suelo. Flexione las rodillas de modo que apunten al techo y los pies queden apoyados en el suelo. Los brazos deben estar paralelos a los costados del cuerpo, junto al cuerpo. Contraiga los glteos y despegue las  nalgas del suelo hasta que la cintura est casi a la misma altura que las rodillas. Debe sentir el trabajo muscular en las nalgas y la parte de atrs de los muslos. Si no siente el esfuerzo de [employer], aleje los pies 1 a 2 pulgadas (2.5 a 5 cm) de las nalgas. Mantenga esta posicin durante 3 a 5 segundos. Baje lentamente las caderas a la posicin inicial y relaje las nalgas por completo. Si este ejercicio le resulta muy fcil, intente realizarlo con los brazos cruzados [place]. Abdominales Repita estos pasos 5 a 10 veces: Acustese boca arriba sobre una cama dura o sobre el suelo con las piernas extendidas. Flexione las rodillas de modo que apunten al techo y los pies queden apoyados en el suelo. Cruce los [employer]. Baje levemente el mentn en direccin al pecho sin doblar el cuello. Contraiga los msculos abdominales y con lentitud eleve el torso lo suficiente como para Artist los omplatos del suelo. No eleve el torso ms que eso, porque esto puede sobreexigir a la zona lumbar y no ayuda a Physiological scientist abdominales. Regrese lentamente a la posicin inicial.  Elevaciones de espalda Repita estos pasos 5 a 10 veces: Acustese sobre el abdomen (boca abajo) con los brazos a los costados del cuerpo y apoye la frente en el suelo. Contraiga los msculos de las piernas y las nalgas. Lentamente despegue el pecho del suelo mientras mantiene las caderas bien apoyadas en el suelo. Mantenga la nuca alineada con la curvatura de la espalda. Los ojos deben mirar al suelo. Mantenga esta posicin durante 3 a 5 segundos. Regrese lentamente a la posicin inicial.  Comunquese con un mdico si: El dolor o las molestias en la espalda se vuelven mucho ms intensos cuando hace un ejercicio. El dolor o las molestias en la espalda que [place], no se Copy en el trmino de las 2 horas posteriores a Copy. Si tiene alguno de [employer], deje de  [employer] ejercicios de inmediato. No vuelva a hacer los ejercicios a menos que el mdico lo autorice. Solicite ayuda de inmediato si: Siente un dolor sbito e intenso en la espalda. Si esto ocurre, deje de [employer] ejercicios de inmediato. No vuelva a hacer los ejercicios a menos que el mdico lo autorice. Esta informacin no tiene Theme park manager el consejo del mdico. Asegrese de hacerle al mdico cualquier pregunta que tenga. Document Revised: 12/02/2020 Document Reviewed: 12/02/2020 Elsevier Patient Education  2024 [employer].

## 2023-02-12 LAB — PSA, TOTAL AND FREE
PSA, Free Pct: 27.1 %
PSA, Free: 0.19 ng/mL
Prostate Specific Ag, Serum: 0.7 ng/mL (ref 0.0–4.0)

## 2023-02-12 LAB — CBC WITH DIFFERENTIAL/PLATELET
Basophils Absolute: 0 10*3/uL (ref 0.0–0.2)
Basos: 1 %
EOS (ABSOLUTE): 0.4 10*3/uL (ref 0.0–0.4)
Eos: 6 %
Hematocrit: 41.3 % (ref 37.5–51.0)
Hemoglobin: 13.8 g/dL (ref 13.0–17.7)
Immature Grans (Abs): 0 10*3/uL (ref 0.0–0.1)
Immature Granulocytes: 0 %
Lymphocytes Absolute: 2 10*3/uL (ref 0.7–3.1)
Lymphs: 30 %
MCH: 33 pg (ref 26.6–33.0)
MCHC: 33.4 g/dL (ref 31.5–35.7)
MCV: 99 fL — ABNORMAL HIGH (ref 79–97)
Monocytes Absolute: 0.7 10*3/uL (ref 0.1–0.9)
Monocytes: 10 %
Neutrophils Absolute: 3.6 10*3/uL (ref 1.4–7.0)
Neutrophils: 53 %
Platelets: 212 10*3/uL (ref 150–450)
RBC: 4.18 x10E6/uL (ref 4.14–5.80)
RDW: 12.9 % (ref 11.6–15.4)
WBC: 6.8 10*3/uL (ref 3.4–10.8)

## 2023-02-12 LAB — LIPID PANEL
Chol/HDL Ratio: 5.1 ratio — ABNORMAL HIGH (ref 0.0–5.0)
Cholesterol, Total: 231 mg/dL — ABNORMAL HIGH (ref 100–199)
HDL: 45 mg/dL (ref >39)
LDL Chol Calc (NIH): 132 mg/dL — ABNORMAL HIGH (ref 0–99)
Triglycerides: 301 mg/dL — ABNORMAL HIGH (ref 0–149)
VLDL Cholesterol Cal: 54 mg/dL — ABNORMAL HIGH (ref 5–40)

## 2023-02-12 LAB — CMP14+EGFR
ALT: 18 IU/L (ref 0–44)
AST: 12 IU/L (ref 0–40)
Albumin: 4.3 g/dL (ref 3.8–4.8)
Alkaline Phosphatase: 58 IU/L (ref 44–121)
BUN/Creatinine Ratio: 14 (ref 10–24)
BUN: 20 mg/dL (ref 8–27)
Bilirubin Total: 0.4 mg/dL (ref 0.0–1.2)
CO2: 22 mmol/L (ref 20–29)
Calcium: 9.6 mg/dL (ref 8.6–10.2)
Chloride: 100 mmol/L (ref 96–106)
Creatinine, Ser: 1.41 mg/dL — ABNORMAL HIGH (ref 0.76–1.27)
Globulin, Total: 2.9 g/dL (ref 1.5–4.5)
Glucose: 94 mg/dL (ref 70–99)
Potassium: 4.4 mmol/L (ref 3.5–5.2)
Sodium: 137 mmol/L (ref 134–144)
Total Protein: 7.2 g/dL (ref 6.0–8.5)
eGFR: 52 mL/min/{1.73_m2} — ABNORMAL LOW (ref >59)

## 2023-02-12 LAB — VITAMIN D 25 HYDROXY (VIT D DEFICIENCY, FRACTURES): Vit D, 25-Hydroxy: 54.2 ng/mL (ref 30.0–100.0)

## 2023-02-13 ENCOUNTER — Encounter: Payer: Self-pay | Admitting: *Deleted

## 2023-02-18 ENCOUNTER — Ambulatory Visit: Payer: Medicare Other | Admitting: Family Medicine

## 2023-03-13 ENCOUNTER — Other Ambulatory Visit: Payer: Self-pay | Admitting: Family Medicine

## 2023-03-13 DIAGNOSIS — E559 Vitamin D deficiency, unspecified: Secondary | ICD-10-CM

## 2023-03-21 ENCOUNTER — Ambulatory Visit: Payer: Medicare Other | Admitting: Family Medicine

## 2023-03-25 ENCOUNTER — Ambulatory Visit: Payer: Medicare Other | Admitting: Family Medicine

## 2023-04-03 ENCOUNTER — Ambulatory Visit: Payer: 59 | Admitting: Family Medicine

## 2023-04-03 ENCOUNTER — Encounter: Payer: Self-pay | Admitting: Family Medicine

## 2023-04-03 VITALS — BP 111/66 | HR 80 | Temp 97.6°F | Ht 65.0 in | Wt 166.6 lb

## 2023-04-03 DIAGNOSIS — N401 Enlarged prostate with lower urinary tract symptoms: Secondary | ICD-10-CM | POA: Diagnosis not present

## 2023-04-03 DIAGNOSIS — R351 Nocturia: Secondary | ICD-10-CM

## 2023-04-03 MED ORDER — FINASTERIDE 5 MG PO TABS: 5.0000 mg | ORAL_TABLET | Freq: Every day | ORAL | 3 refills | Status: DC

## 2023-04-03 MED ORDER — ROSUVASTATIN CALCIUM 10 MG PO TABS: 10.0000 mg | ORAL_TABLET | Freq: Every day | ORAL | 1 refills | Status: DC

## 2023-04-03 NOTE — Progress Notes (Signed)
Subjective:  Patient ID: Clarence Marshall, male    DOB: 06-24-1950  Age: 72 y.o. MRN: [id number]  CC: Follow-up   HPI Clarence Marshall presents for recheck of prostate.Taking medication for BPH as prescribed. Nocturia  Down from 3 times a night to 1 time a night.     04/03/2023    2:59 PM 02/11/2023    1:59 PM 02/13/2022    2:25 PM  Depression screen PHQ 2/9  Decreased Interest 0 0 0  Down, Depressed, Hopeless 0 0 0  PHQ - 2 Score 0 0 0    History [place] has no past medical history on file.   He has no past surgical history on file.   His family history includes Arthritis in his brother; Heart attack in his mother.He reports that he quit smoking about 40 years ago. His smoking use included cigarettes. He started smoking about 50 years ago. He has a 10.4 pack-year smoking history. He does not have any smokeless tobacco history on file. He reports current alcohol use. He reports that he does not use drugs.    ROS Review of Systems  Constitutional:  Negative for fever.  Respiratory:  Negative for shortness of breath.   Cardiovascular:  Negative for chest pain.  Genitourinary:  Positive for frequency. Negative for dysuria, enuresis, hematuria and urgency.  Musculoskeletal:  Negative for arthralgias.  Skin:  Negative for rash.    Objective:  BP 111/66   Pulse 80   Temp 97.6 F (36.4 C)   Ht 5\' 5"  (1.651 m)   Wt 166 lb 9.6 oz (75.6 kg)   SpO2 95%   BMI 27.72 kg/m   BP Readings from Last 3 Encounters:  04/03/23 111/66  02/11/23 111/66  02/13/22 123/67    Wt Readings from Last 3 Encounters:  04/03/23 166 lb 9.6 oz (75.6 kg)  02/11/23 164 lb (74.4 kg)  02/13/22 159 lb 6.4 oz (72.3 kg)     Physical Exam Vitals reviewed.  Constitutional:      Appearance: He is well-developed.  HENT:     Head: Normocephalic and atraumatic.     Right Ear: External ear normal.     Left Ear: External ear normal.     Mouth/Throat:     Pharynx: No oropharyngeal exudate or posterior  oropharyngeal erythema.  Eyes:     Pupils: Pupils are equal, round, and reactive to light.  Cardiovascular:     Rate and Rhythm: Normal rate and regular rhythm.     Heart sounds: No murmur heard. Pulmonary:     Effort: No respiratory distress.     Breath sounds: Normal breath sounds.  Musculoskeletal:     Cervical back: Normal range of motion and neck supple.  Neurological:     Mental Status: He is alert and oriented to person, place, and time.       Assessment & Plan:   Clarence Marshall was seen today for follow-up.  Diagnoses and all orders for this visit:  Benign prostatic hyperplasia with nocturia  Other orders -     rosuvastatin (CRESTOR) 10 MG tablet; Take 1 tablet (10 mg total) by mouth daily. For cholesterol -     finasteride (PROSCAR) 5 MG tablet; Take 1 tablet (5 mg total) by mouth daily. For urine flow       I have discontinued Sanav G. Knaggs's predniSONE. I am also having him start on rosuvastatin and finasteride. Additionally, I am having him maintain his sildenafil, tamsulosin, tiZANidine, and Vitamin D (  Ergocalciferol).  Allergies as of 04/03/2023       Reactions   Lamisil [terbinafine] Rash        Medication List        Accurate as of April 03, 2023  3:37 PM. If you have any questions, ask your nurse or doctor.          STOP taking these medications    predniSONE 10 MG tablet Commonly known as: DELTASONE Stopped by: Alona Danford       TAKE these medications    finasteride 5 MG tablet Commonly known as: Proscar Take 1 tablet (5 mg total) by mouth daily. For urine flow Started by: Rhet Rorke   rosuvastatin 10 MG tablet Commonly known as: Crestor Take 1 tablet (10 mg total) by mouth daily. For cholesterol Started by: Marque Rademaker   sildenafil 100 MG tablet Commonly known as: Viagra Take 0.5-1 tablets (50-100 mg total) by mouth daily as needed for erectile dysfunction (sex).   tamsulosin 0.4 MG Caps capsule Commonly known as:  FLOMAX Take 2 capsules (0.8 mg total) by mouth at bedtime. For urine flow and prostate   tiZANidine 4 MG tablet Commonly known as: ZANAFLEX Take 1 tablet (4 mg total) by mouth every 6 (six) hours as needed for muscle spasms.   Vitamin D (Ergocalciferol) 1.25 MG (50000 UNIT) Caps capsule Commonly known as: DRISDOL TAKE 1 CAPSULE BY MOUTH ONCE A WEEK         Follow-up: Return in about 6 months (around 10/01/2023).  Mechele Claude, M.D.

## 2023-07-05 ENCOUNTER — Telehealth: Payer: Self-pay | Admitting: Family Medicine

## 2023-07-05 NOTE — Telephone Encounter (Signed)
 Called pt and pt is not requesting tamiflu. Sent as error. LS

## 2023-07-05 NOTE — Telephone Encounter (Signed)
 Copied from CRM [id number]. Topic: Clinical - Medication Refill >> Jul 05, 2023 10:14 AM [name] wrote: Most Recent Primary Care Visit:  Provider: ZOLLIE LOWERS  Department: [facility]  Visit Type: OFFICE VISIT  Date: 04/03/2023  Medication: Tamiflu  Has the patient contacted their pharmacy? No (Agent: If no, request that the patient contact the pharmacy for the refill. If patient does not wish to contact the pharmacy document the reason why and proceed with request.) (Agent: If yes, when and what did the pharmacy advise?)  Is this the correct pharmacy for this prescription? Yes If no, delete pharmacy and type the correct one.  This is the patient's preferred pharmacy:  [facility] [address] [place] - [address] [place] [address] Phone: [phone] Fax: [phone]  [id number] - [place], [place] - [address] [place] [address] Phone: [phone] Fax: [phone]   Has the prescription been filled recently? No  Is the patient out of the medication? No  Has the patient been seen for an appointment in the last year OR does the patient have an upcoming appointment? Yes  Can we respond through MyChart? Yes  Agent: Please be advised that Rx refills may take up to 3 business days. We ask that you follow-up with your pharmacy.

## 2023-07-08 ENCOUNTER — Ambulatory Visit: Payer: 59 | Admitting: Family Medicine

## 2023-07-08 ENCOUNTER — Encounter: Payer: Self-pay | Admitting: Family Medicine

## 2023-07-08 NOTE — Progress Notes (Deleted)
 Subjective: CC:*** PCP: Mechele Claude, MD ZOX:WRUE CARTER KASSEL is a 73 y.o. male presenting to clinic today for:  1. ***   ROS: Per HPI  Allergies  Allergen Reactions   Lamisil [Terbinafine] Rash   No past medical history on file.  Current Outpatient Medications:    finasteride (PROSCAR) 5 MG tablet, Take 1 tablet (5 mg total) by mouth daily. For urine flow, Disp: 90 tablet, Rfl: 3   rosuvastatin (CRESTOR) 10 MG tablet, Take 1 tablet (10 mg total) by mouth daily. For cholesterol, Disp: 90 tablet, Rfl: 1   sildenafil (VIAGRA) 100 MG tablet, Take 0.5-1 tablets (50-100 mg total) by mouth daily as needed for erectile dysfunction (sex)., Disp: 8 tablet, Rfl: 11   tamsulosin (FLOMAX) 0.4 MG CAPS capsule, Take 2 capsules (0.8 mg total) by mouth at bedtime. For urine flow and prostate, Disp: 180 capsule, Rfl: 3   tiZANidine (ZANAFLEX) 4 MG tablet, Take 1 tablet (4 mg total) by mouth every 6 (six) hours as needed for muscle spasms., Disp: 30 tablet, Rfl: 1   Vitamin D, Ergocalciferol, (DRISDOL) 1.25 MG (50000 UNIT) CAPS capsule, TAKE 1 CAPSULE BY MOUTH ONCE A WEEK, Disp: 13 capsule, Rfl: 1 Social History   Socioeconomic History   Marital status: Married    Spouse name: Carley Hammed   Number of children: 1   Years of education: Not on file   Highest education level: Not on file  Occupational History   Not on file  Tobacco Use   Smoking status: Former    Current packs/day: 0.00    Average packs/day: 1 pack/day for 10.4 years (10.4 ttl pk-yrs)    Types: Cigarettes    Start date: 05/28/1972    Quit date: 10/29/1982    Years since quitting: 40.7   Smokeless tobacco: Not on file  Vaping Use   Vaping status: Never Used  Substance and Sexual Activity   Alcohol use: Yes    Comment: occasional   Drug use: No   Sexual activity: Not on file  Other Topics Concern   Not on file  Social History Narrative   Not on file   Social Drivers of Health   Financial Resource Strain: Not on file  Food  Insecurity: Not on file  Transportation Needs: Not on file  Physical Activity: Not on file  Stress: Not on file  Social Connections: Not on file  Intimate Partner Violence: Not on file   Family History  Problem Relation Age of Onset   Heart attack Mother    Arthritis Brother     Objective: Office vital signs reviewed. There were no vitals taken for this visit.  Physical Examination:  General: Awake, alert, *** nourished, No acute distress HEENT: Normal    Neck: No masses palpated. No lymphadenopathy    Ears: Tympanic membranes intact, normal light reflex, no erythema, no bulging    Eyes: PERRLA, extraocular membranes intact, sclera ***    Nose: nasal turbinates moist, *** nasal discharge    Throat: moist mucus membranes, no erythema, *** tonsillar exudate.  Airway is patent Cardio: regular rate and rhythm, S1S2 heard, no murmurs appreciated Pulm: clear to auscultation bilaterally, no wheezes, rhonchi or rales; normal work of breathing on room air GI: soft, non-tender, non-distended, bowel sounds present x4, no hepatomegaly, no splenomegaly, no masses GU: external vaginal tissue ***, cervix ***, *** punctate lesions on cervix appreciated, *** discharge from cervical os, *** bleeding, *** cervical motion tenderness, *** abdominal/ adnexal masses Extremities: warm, well perfused,  No edema, cyanosis or clubbing; +*** pulses bilaterally MSK: *** gait and *** station Skin: dry; intact; no rashes or lesions Neuro: *** Strength and light touch sensation grossly intact, *** DTRs ***/4  Assessment/ Plan: 73 y.o. male   Flu-like symptoms  ***   Raliegh Ip, DO [facility] [phone]

## 2023-07-22 ENCOUNTER — Other Ambulatory Visit: Payer: Self-pay | Admitting: Family Medicine

## 2023-07-23 ENCOUNTER — Telehealth: Payer: Self-pay | Admitting: Family Medicine

## 2023-07-23 NOTE — Telephone Encounter (Signed)
 Copied from CRM [id number]. Topic: Appointments - Scheduling Inquiry for Clinic >> Jul 23, 2023 12:12 PM Clarence Marshall wrote: Reason for CRM: pt received his no show letter for  2/10.  Pt states he did not realize the daughter made this appt.  He says he did not need the appt, he had the flu and was home in the bed.  The letter upset him, he says he wants to clear hs record and his name b/c he does not want any problems in the future.

## 2023-10-02 ENCOUNTER — Ambulatory Visit: Payer: Medicare Other | Admitting: Family Medicine

## 2023-10-03 ENCOUNTER — Encounter: Payer: Self-pay | Admitting: Family Medicine

## 2023-11-18 ENCOUNTER — Other Ambulatory Visit: Payer: Self-pay | Admitting: Family Medicine

## 2023-11-18 DIAGNOSIS — E559 Vitamin D deficiency, unspecified: Secondary | ICD-10-CM

## 2024-02-13 ENCOUNTER — Other Ambulatory Visit: Payer: Self-pay | Admitting: Family Medicine

## 2024-02-13 NOTE — Telephone Encounter (Signed)
 Stacks NTBS last chronic FU 02/11/23 NO RF sent to pharmacy last OV a year ago

## 2024-02-13 NOTE — Telephone Encounter (Signed)
 I called pt & spoke to his wife about him making an appt w/[facility] to get more refills on his meds. Wife will let him know, when he gets home.

## 2024-03-16 ENCOUNTER — Other Ambulatory Visit: Payer: Self-pay | Admitting: Family Medicine

## 2024-03-17 ENCOUNTER — Encounter: Payer: Self-pay | Admitting: Family Medicine

## 2024-03-17 ENCOUNTER — Ambulatory Visit: Admitting: Family Medicine

## 2024-03-17 VITALS — BP 125/69 | HR 60 | Temp 98.5°F | Ht 65.0 in | Wt 163.8 lb

## 2024-03-17 DIAGNOSIS — N401 Enlarged prostate with lower urinary tract symptoms: Secondary | ICD-10-CM

## 2024-03-17 DIAGNOSIS — E559 Vitamin D deficiency, unspecified: Secondary | ICD-10-CM | POA: Diagnosis not present

## 2024-03-17 DIAGNOSIS — E782 Mixed hyperlipidemia: Secondary | ICD-10-CM

## 2024-03-17 DIAGNOSIS — K2100 Gastro-esophageal reflux disease with esophagitis, without bleeding: Secondary | ICD-10-CM | POA: Diagnosis not present

## 2024-03-17 DIAGNOSIS — R351 Nocturia: Secondary | ICD-10-CM

## 2024-03-17 MED ORDER — VITAMIN D (ERGOCALCIFEROL) 1.25 MG (50000 UNIT) PO CAPS: 50000.0000 [IU] | ORAL_CAPSULE | ORAL | 1 refills | Status: DC

## 2024-03-17 MED ORDER — ROSUVASTATIN CALCIUM 10 MG PO TABS: 10.0000 mg | ORAL_TABLET | Freq: Every day | ORAL | 3 refills | Status: DC

## 2024-03-17 NOTE — Progress Notes (Signed)
 1  Subjective:  Patient ID: Clarence Marshall, male    DOB: 1951-02-14  Age: 73 y.o. MRN: [id number]  CC: Medical Management of Chronic Issues (Medication refills/)   HPI  Discussed the use of AI scribe software for clinical note transcription with the patient, who gave verbal consent to proceed.  History of Present Illness Clarence Marshall is a 73 year old male who presents for evaluation of his cholesterol medication and prostate health.  He is concerned about his cholesterol medication, which he has been taking since his last annual check-up. He recently finished his supply and was informed by the pharmacy that he needs to consult with his doctor before obtaining more. Prior to starting the medication, he experienced a 'funny feeling' on the left side of his chest, which has since resolved.  Regarding his prostate health, he has previously tried prescribed medications such as finasteride  and Proscar , but found them ineffective. Instead, he has been using over-the-counter supplements like Prostason and Prosvent, which he reports have been more effective in managing his symptoms. He experiences nocturia, typically needing to urinate twice a night, though some nights he does not need to go at all. He feels that his current regimen is working well enough for now.  He is taking vitamin D2, which he obtained from the pharmacy recently, and also takes vitamin C daily. No issues with swelling in his feet and ankles, shortness of breath, chest pain, or heart palpitations.          03/17/2024    4:20 PM 04/03/2023    2:59 PM 02/11/2023    1:59 PM  Depression screen PHQ 2/9  Decreased Interest 0 0 0  Down, Depressed, Hopeless 0 0 0  PHQ - 2 Score 0 0 0    History Zeferino has no past medical history on file.   He has no past surgical history on file.   His family history includes Arthritis in his brother; Heart attack in his mother.He reports that he quit smoking about 41 years ago. His smoking use  included cigarettes. He started smoking about 51 years ago. He has a 10.4 pack-year smoking history. He does not have any smokeless tobacco history on file. He reports current alcohol use. He reports that he does not use drugs.    ROS Review of Systems  Constitutional: Negative.   HENT: Negative.    Eyes:  Negative for visual disturbance.  Respiratory:  Negative for cough and shortness of breath.   Cardiovascular:  Negative for chest pain and leg swelling.  Gastrointestinal:  Negative for abdominal pain, diarrhea, nausea and vomiting.  Genitourinary:  Positive for frequency. Negative for difficulty urinating.  Musculoskeletal:  Negative for arthralgias and myalgias.  Skin:  Negative for rash.  Neurological:  Negative for headaches.  Psychiatric/Behavioral:  Negative for sleep disturbance.     Objective:  BP 125/69   Pulse 60   Temp 98.5 F (36.9 C)   Ht 5' 5 (1.651 m)   Wt 163 lb 12.8 oz (74.3 kg)   SpO2 97%   BMI 27.26 kg/m   BP Readings from Last 3 Encounters:  03/17/24 125/69  04/03/23 111/66  02/11/23 111/66    Wt Readings from Last 3 Encounters:  03/17/24 163 lb 12.8 oz (74.3 kg)  04/03/23 166 lb 9.6 oz (75.6 kg)  02/11/23 164 lb (74.4 kg)     Physical Exam Vitals reviewed.  Constitutional:      Appearance: He is well-developed.  HENT:  Head: Normocephalic and atraumatic.     Right Ear: External ear normal.     Left Ear: External ear normal.     Mouth/Throat:     Pharynx: No oropharyngeal exudate or posterior oropharyngeal erythema.  Eyes:     Pupils: Pupils are equal, round, and reactive to light.  Cardiovascular:     Rate and Rhythm: Normal rate and regular rhythm.     Heart sounds: No murmur heard. Pulmonary:     Effort: No respiratory distress.     Breath sounds: Normal breath sounds.  Musculoskeletal:     Cervical back: Normal range of motion and neck supple.  Neurological:     Mental Status: He is alert and oriented to person, place, and  time.    Physical Exam GENERAL: Alert, cooperative, well developed, no acute distress. HEENT: Normocephalic, normal oropharynx, moist mucous membranes, eyes normal, throat normal. NECK: Carotid pulses normal bilaterally. CHEST: Clear to auscultation bilaterally, no wheezes, rhonchi, or crackles. CARDIOVASCULAR: Normal heart rate and rhythm, S1 and S2 normal without murmurs. ABDOMEN: Soft, non-tender, non-distended, without organomegaly, normal bowel sounds. EXTREMITIES: No cyanosis or edema. NEUROLOGICAL: Cranial nerves grossly intact, moves all extremities without gross motor or sensory deficit.   Assessment & Plan:  Benign prostatic hyperplasia with nocturia  Gastroesophageal reflux disease with esophagitis without hemorrhage -     CBC with Differential/Platelet  Mixed hyperlipidemia -     CMP14+EGFR -     Lipid panel  Vitamin D  deficiency -     VITAMIN D  25 Hydroxy (Vit-D Deficiency, Fractures) -     Vitamin D  (Ergocalciferol ); Take 1 capsule (50,000 Units total) by mouth once a week.  Dispense: 13 capsule; Refill: 1  Other orders -     Rosuvastatin  Calcium ; Take 1 tablet (10 mg total) by mouth daily.  Dispense: 90 tablet; Refill: 3    Assessment and Plan Assessment & Plan Hyperlipidemia   He is concerned about continuing rosuvastatin  for hyperlipidemia. Discontinuing could increase cholesterol levels and risk of cardiovascular events. Order blood work to check cholesterol levels and ensure he continues rosuvastatin  10 MG daily.  Benign prostatic hyperplasia with nocturia   He uses over-the-counter supplements, Prostason and Prosvent, for benign prostatic hyperplasia symptoms. He experiences nocturia twice a night, occasionally not at all. The supplements are effective for now, but consider a specialist referral if symptoms worsen. Continue current supplements.  Vitamin D  deficiency   He is taking vitamin D2 as prescribed to address low vitamin D  levels. Continue Vitamin  D (ergocalciferol ) 1.25 MG once a week.       Follow-up: Return in about 1 year (around 03/17/2025) for [facility] physical.  Butler Der, M.D.

## 2024-03-18 ENCOUNTER — Other Ambulatory Visit

## 2024-03-18 MED ORDER — VITAMIN D (ERGOCALCIFEROL) 1.25 MG (50000 UNIT) PO CAPS: 50000.0000 [IU] | ORAL_CAPSULE | ORAL | 1 refills | Status: AC

## 2024-03-18 MED ORDER — ROSUVASTATIN CALCIUM 10 MG PO TABS: 10.0000 mg | ORAL_TABLET | Freq: Every day | ORAL | 3 refills | Status: AC

## 2024-03-18 NOTE — Progress Notes (Addendum)
 Pt does not use [facility], scripts resent to [facility] removed from profile

## 2024-03-18 NOTE — Addendum Note (Signed)
 Addended by: Mylena Sedberry D on: 03/18/2024 10:21 AM   Modules accepted: Orders

## 2024-03-19 LAB — CMP14+EGFR
ALT: 16 IU/L (ref 0–44)
AST: 15 IU/L (ref 0–40)
Albumin: 4.3 g/dL (ref 3.8–4.8)
Alkaline Phosphatase: 62 IU/L (ref 47–123)
BUN/Creatinine Ratio: 13 (ref 10–24)
BUN: 17 mg/dL (ref 8–27)
Bilirubin Total: 0.3 mg/dL (ref 0.0–1.2)
CO2: 24 mmol/L (ref 20–29)
Calcium: 9.5 mg/dL (ref 8.6–10.2)
Chloride: 102 mmol/L (ref 96–106)
Creatinine, Ser: 1.31 mg/dL — AB (ref 0.76–1.27)
Globulin, Total: 2.5 g/dL (ref 1.5–4.5)
Glucose: 113 mg/dL — ABNORMAL HIGH (ref 70–99)
Potassium: 4.4 mmol/L (ref 3.5–5.2)
Sodium: 140 mmol/L (ref 134–144)
Total Protein: 6.8 g/dL (ref 6.0–8.5)
eGFR: 57 mL/min/1.73 — AB (ref >59)

## 2024-03-19 LAB — VITAMIN D 25 HYDROXY (VIT D DEFICIENCY, FRACTURES): Vit D, 25-Hydroxy: 66.3 ng/mL (ref 30.0–100.0)

## 2024-03-19 LAB — CBC WITH DIFFERENTIAL/PLATELET
Basophils Absolute: 0.1 x10E3/uL (ref 0.0–0.2)
Basos: 1 %
EOS (ABSOLUTE): 0.4 x10E3/uL (ref 0.0–0.4)
Eos: 5 %
Hematocrit: 43.8 % (ref 37.5–51.0)
Hemoglobin: 14.2 g/dL (ref 13.0–17.7)
Immature Grans (Abs): 0 x10E3/uL (ref 0.0–0.1)
Immature Granulocytes: 0 %
Lymphocytes Absolute: 2.1 x10E3/uL (ref 0.7–3.1)
Lymphs: 29 %
MCH: 31.8 pg (ref 26.6–33.0)
MCHC: 32.4 g/dL (ref 31.5–35.7)
MCV: 98 fL — ABNORMAL HIGH (ref 79–97)
Monocytes Absolute: 0.7 x10E3/uL (ref 0.1–0.9)
Monocytes: 10 %
Neutrophils Absolute: 3.9 x10E3/uL (ref 1.4–7.0)
Neutrophils: 55 %
Platelets: 199 x10E3/uL (ref 150–450)
RBC: 4.47 x10E6/uL (ref 4.14–5.80)
RDW: 12.9 % (ref 11.6–15.4)
WBC: 7.2 x10E3/uL (ref 3.4–10.8)

## 2024-03-19 LAB — LIPID PANEL
Chol/HDL Ratio: 2.8 ratio (ref 0.0–5.0)
Cholesterol, Total: 144 mg/dL (ref 100–199)
HDL: 51 mg/dL (ref >39)
LDL Chol Calc (NIH): 69 mg/dL (ref 0–99)
Triglycerides: 136 mg/dL (ref 0–149)
VLDL Cholesterol Cal: 24 mg/dL (ref 5–40)

## 2024-03-25 ENCOUNTER — Ambulatory Visit: Payer: Self-pay | Admitting: Family Medicine

## 2024-03-25 NOTE — Progress Notes (Signed)
 Hello Coran,    Your lab result is normal and/or stable.Some minor variations that are not significant are commonly marked abnormal, but do not represent any medical problem for you.  Best regards, Butler Der, M.D.

## 2025-03-18 ENCOUNTER — Encounter: Payer: Self-pay | Admitting: Family Medicine
